# Patient Record
Sex: Female | Born: 1935 | Race: White | Hispanic: No | Marital: Married | Smoking: Never smoker
Health system: Southern US, Community
[De-identification: ages and names within clinical notes are randomized; demographics above are authoritative.]

## PROBLEM LIST (undated history)

## (undated) DIAGNOSIS — IMO0001 Reserved for inherently not codable concepts without codable children: Secondary | ICD-10-CM

## (undated) DIAGNOSIS — K219 Gastro-esophageal reflux disease without esophagitis: Secondary | ICD-10-CM

## (undated) DIAGNOSIS — I10 Essential (primary) hypertension: Secondary | ICD-10-CM

## (undated) DIAGNOSIS — M1990 Unspecified osteoarthritis, unspecified site: Secondary | ICD-10-CM

## (undated) HISTORY — PX: TOTAL KNEE ARTHROPLASTY: SHX125

## (undated) HISTORY — DX: Unspecified osteoarthritis, unspecified site: M19.90

## (undated) HISTORY — DX: Reserved for inherently not codable concepts without codable children: IMO0001

## (undated) HISTORY — DX: Essential (primary) hypertension: I10

## (undated) HISTORY — PX: BUNIONECTOMY: SHX129

## (undated) HISTORY — DX: Gastro-esophageal reflux disease without esophagitis: K21.9

## (undated) HISTORY — PX: BACK SURGERY: SHX140

---

## 2004-10-07 ENCOUNTER — Ambulatory Visit: Payer: Self-pay | Admitting: Family Medicine

## 2004-12-10 ENCOUNTER — Ambulatory Visit: Payer: Self-pay | Admitting: General Surgery

## 2005-10-26 ENCOUNTER — Other Ambulatory Visit: Payer: Self-pay

## 2005-10-26 ENCOUNTER — Ambulatory Visit: Payer: Self-pay | Admitting: Obstetrics and Gynecology

## 2005-11-08 ENCOUNTER — Inpatient Hospital Stay: Payer: Self-pay | Admitting: Obstetrics and Gynecology

## 2006-03-31 ENCOUNTER — Ambulatory Visit: Payer: Self-pay | Admitting: Family Medicine

## 2007-04-24 ENCOUNTER — Ambulatory Visit: Payer: Self-pay | Admitting: Ophthalmology

## 2007-05-01 ENCOUNTER — Ambulatory Visit: Payer: Self-pay | Admitting: Ophthalmology

## 2008-01-16 ENCOUNTER — Emergency Department: Payer: Self-pay | Admitting: Emergency Medicine

## 2008-06-11 ENCOUNTER — Ambulatory Visit: Payer: Self-pay | Admitting: Family Medicine

## 2008-07-03 ENCOUNTER — Ambulatory Visit: Payer: Self-pay | Admitting: Gastroenterology

## 2009-10-14 ENCOUNTER — Emergency Department: Payer: Self-pay | Admitting: Emergency Medicine

## 2009-11-19 ENCOUNTER — Ambulatory Visit: Payer: Self-pay | Admitting: Family Medicine

## 2010-02-02 ENCOUNTER — Ambulatory Visit: Payer: Self-pay | Admitting: Anesthesiology

## 2010-03-10 ENCOUNTER — Ambulatory Visit: Payer: Self-pay | Admitting: Anesthesiology

## 2010-04-02 ENCOUNTER — Ambulatory Visit: Payer: Self-pay | Admitting: Anesthesiology

## 2010-05-14 ENCOUNTER — Ambulatory Visit: Payer: Self-pay | Admitting: Anesthesiology

## 2010-06-11 ENCOUNTER — Ambulatory Visit: Payer: Self-pay | Admitting: Anesthesiology

## 2010-08-20 ENCOUNTER — Ambulatory Visit: Payer: Self-pay | Admitting: Family Medicine

## 2010-09-07 ENCOUNTER — Emergency Department: Payer: Self-pay | Admitting: Emergency Medicine

## 2010-11-10 DIAGNOSIS — I10 Essential (primary) hypertension: Secondary | ICD-10-CM

## 2010-11-10 DIAGNOSIS — R5381 Other malaise: Secondary | ICD-10-CM

## 2010-11-10 DIAGNOSIS — K219 Gastro-esophageal reflux disease without esophagitis: Secondary | ICD-10-CM

## 2010-11-10 DIAGNOSIS — M159 Polyosteoarthritis, unspecified: Secondary | ICD-10-CM

## 2010-11-10 DIAGNOSIS — R5383 Other fatigue: Secondary | ICD-10-CM

## 2010-11-20 DIAGNOSIS — J029 Acute pharyngitis, unspecified: Secondary | ICD-10-CM

## 2010-12-11 DIAGNOSIS — M1990 Unspecified osteoarthritis, unspecified site: Secondary | ICD-10-CM

## 2010-12-11 DIAGNOSIS — M810 Age-related osteoporosis without current pathological fracture: Secondary | ICD-10-CM

## 2010-12-11 DIAGNOSIS — I10 Essential (primary) hypertension: Secondary | ICD-10-CM

## 2011-01-14 DIAGNOSIS — M159 Polyosteoarthritis, unspecified: Secondary | ICD-10-CM

## 2011-01-14 DIAGNOSIS — G20 Parkinson's disease: Secondary | ICD-10-CM

## 2011-01-14 DIAGNOSIS — I10 Essential (primary) hypertension: Secondary | ICD-10-CM

## 2011-01-14 DIAGNOSIS — K219 Gastro-esophageal reflux disease without esophagitis: Secondary | ICD-10-CM

## 2011-01-19 ENCOUNTER — Ambulatory Visit: Payer: Self-pay | Admitting: Internal Medicine

## 2011-02-12 DIAGNOSIS — M1990 Unspecified osteoarthritis, unspecified site: Secondary | ICD-10-CM

## 2011-02-12 DIAGNOSIS — I10 Essential (primary) hypertension: Secondary | ICD-10-CM

## 2011-02-12 DIAGNOSIS — M810 Age-related osteoporosis without current pathological fracture: Secondary | ICD-10-CM

## 2011-03-29 DIAGNOSIS — M25579 Pain in unspecified ankle and joints of unspecified foot: Secondary | ICD-10-CM

## 2011-04-26 DIAGNOSIS — K219 Gastro-esophageal reflux disease without esophagitis: Secondary | ICD-10-CM

## 2011-04-26 DIAGNOSIS — I10 Essential (primary) hypertension: Secondary | ICD-10-CM

## 2011-04-26 DIAGNOSIS — G20 Parkinson's disease: Secondary | ICD-10-CM

## 2011-04-26 DIAGNOSIS — M159 Polyosteoarthritis, unspecified: Secondary | ICD-10-CM

## 2011-05-06 DIAGNOSIS — M25579 Pain in unspecified ankle and joints of unspecified foot: Secondary | ICD-10-CM

## 2011-05-06 DIAGNOSIS — L84 Corns and callosities: Secondary | ICD-10-CM

## 2011-05-24 DIAGNOSIS — I498 Other specified cardiac arrhythmias: Secondary | ICD-10-CM

## 2011-06-16 DIAGNOSIS — K219 Gastro-esophageal reflux disease without esophagitis: Secondary | ICD-10-CM

## 2011-06-16 DIAGNOSIS — I10 Essential (primary) hypertension: Secondary | ICD-10-CM

## 2011-06-16 DIAGNOSIS — M810 Age-related osteoporosis without current pathological fracture: Secondary | ICD-10-CM

## 2011-06-16 DIAGNOSIS — M1990 Unspecified osteoarthritis, unspecified site: Secondary | ICD-10-CM

## 2011-08-20 DIAGNOSIS — I10 Essential (primary) hypertension: Secondary | ICD-10-CM

## 2011-08-20 DIAGNOSIS — M1990 Unspecified osteoarthritis, unspecified site: Secondary | ICD-10-CM

## 2011-08-20 DIAGNOSIS — IMO0002 Reserved for concepts with insufficient information to code with codable children: Secondary | ICD-10-CM

## 2011-09-16 DIAGNOSIS — R1033 Periumbilical pain: Secondary | ICD-10-CM

## 2011-09-22 DIAGNOSIS — K922 Gastrointestinal hemorrhage, unspecified: Secondary | ICD-10-CM

## 2011-09-22 DIAGNOSIS — R5383 Other fatigue: Secondary | ICD-10-CM

## 2011-09-22 DIAGNOSIS — R5381 Other malaise: Secondary | ICD-10-CM

## 2011-09-27 ENCOUNTER — Ambulatory Visit: Payer: Self-pay | Admitting: Family Medicine

## 2011-09-28 ENCOUNTER — Encounter: Payer: Self-pay | Admitting: Family Medicine

## 2011-09-29 DIAGNOSIS — R634 Abnormal weight loss: Secondary | ICD-10-CM

## 2011-09-29 DIAGNOSIS — F329 Major depressive disorder, single episode, unspecified: Secondary | ICD-10-CM

## 2011-09-29 DIAGNOSIS — D539 Nutritional anemia, unspecified: Secondary | ICD-10-CM

## 2011-09-29 DIAGNOSIS — I10 Essential (primary) hypertension: Secondary | ICD-10-CM

## 2011-10-20 ENCOUNTER — Ambulatory Visit: Payer: Self-pay | Admitting: Gastroenterology

## 2011-11-24 ENCOUNTER — Ambulatory Visit: Payer: Self-pay | Admitting: Gastroenterology

## 2011-12-02 ENCOUNTER — Encounter: Payer: Self-pay | Admitting: Internal Medicine

## 2011-12-08 DIAGNOSIS — M810 Age-related osteoporosis without current pathological fracture: Secondary | ICD-10-CM

## 2011-12-08 DIAGNOSIS — I10 Essential (primary) hypertension: Secondary | ICD-10-CM

## 2011-12-08 DIAGNOSIS — M1990 Unspecified osteoarthritis, unspecified site: Secondary | ICD-10-CM

## 2011-12-10 DIAGNOSIS — M1990 Unspecified osteoarthritis, unspecified site: Secondary | ICD-10-CM

## 2011-12-24 DIAGNOSIS — M1990 Unspecified osteoarthritis, unspecified site: Secondary | ICD-10-CM

## 2012-02-02 DIAGNOSIS — M1990 Unspecified osteoarthritis, unspecified site: Secondary | ICD-10-CM

## 2012-02-02 DIAGNOSIS — F329 Major depressive disorder, single episode, unspecified: Secondary | ICD-10-CM

## 2012-02-02 DIAGNOSIS — I10 Essential (primary) hypertension: Secondary | ICD-10-CM

## 2012-04-11 DIAGNOSIS — F329 Major depressive disorder, single episode, unspecified: Secondary | ICD-10-CM

## 2012-04-11 DIAGNOSIS — G20 Parkinson's disease: Secondary | ICD-10-CM

## 2012-04-11 DIAGNOSIS — I10 Essential (primary) hypertension: Secondary | ICD-10-CM

## 2012-04-11 DIAGNOSIS — K210 Gastro-esophageal reflux disease with esophagitis: Secondary | ICD-10-CM

## 2012-06-14 DIAGNOSIS — IMO0002 Reserved for concepts with insufficient information to code with codable children: Secondary | ICD-10-CM

## 2012-06-14 DIAGNOSIS — I10 Essential (primary) hypertension: Secondary | ICD-10-CM

## 2012-06-14 DIAGNOSIS — M1990 Unspecified osteoarthritis, unspecified site: Secondary | ICD-10-CM

## 2012-08-11 DIAGNOSIS — M1990 Unspecified osteoarthritis, unspecified site: Secondary | ICD-10-CM

## 2012-08-11 DIAGNOSIS — G20 Parkinson's disease: Secondary | ICD-10-CM

## 2012-08-11 DIAGNOSIS — I10 Essential (primary) hypertension: Secondary | ICD-10-CM

## 2012-08-11 DIAGNOSIS — IMO0002 Reserved for concepts with insufficient information to code with codable children: Secondary | ICD-10-CM

## 2012-08-11 DIAGNOSIS — F329 Major depressive disorder, single episode, unspecified: Secondary | ICD-10-CM

## 2012-10-20 DIAGNOSIS — G20 Parkinson's disease: Secondary | ICD-10-CM

## 2012-10-20 DIAGNOSIS — F329 Major depressive disorder, single episode, unspecified: Secondary | ICD-10-CM

## 2012-10-20 DIAGNOSIS — M810 Age-related osteoporosis without current pathological fracture: Secondary | ICD-10-CM

## 2012-10-20 DIAGNOSIS — I10 Essential (primary) hypertension: Secondary | ICD-10-CM

## 2012-10-20 DIAGNOSIS — K219 Gastro-esophageal reflux disease without esophagitis: Secondary | ICD-10-CM

## 2012-11-16 DIAGNOSIS — I6789 Other cerebrovascular disease: Secondary | ICD-10-CM

## 2012-11-30 DIAGNOSIS — I6990 Unspecified sequelae of unspecified cerebrovascular disease: Secondary | ICD-10-CM

## 2012-11-30 DIAGNOSIS — G20 Parkinson's disease: Secondary | ICD-10-CM

## 2012-11-30 DIAGNOSIS — F329 Major depressive disorder, single episode, unspecified: Secondary | ICD-10-CM

## 2012-11-30 DIAGNOSIS — K210 Gastro-esophageal reflux disease with esophagitis: Secondary | ICD-10-CM

## 2012-12-26 DIAGNOSIS — L0291 Cutaneous abscess, unspecified: Secondary | ICD-10-CM

## 2012-12-26 DIAGNOSIS — L0390 Cellulitis, unspecified: Secondary | ICD-10-CM

## 2013-01-03 DIAGNOSIS — F99 Mental disorder, not otherwise specified: Secondary | ICD-10-CM

## 2013-01-03 DIAGNOSIS — G9009 Other idiopathic peripheral autonomic neuropathy: Secondary | ICD-10-CM

## 2013-01-31 DIAGNOSIS — I10 Essential (primary) hypertension: Secondary | ICD-10-CM

## 2013-01-31 DIAGNOSIS — M1710 Unilateral primary osteoarthritis, unspecified knee: Secondary | ICD-10-CM

## 2013-01-31 DIAGNOSIS — G219 Secondary parkinsonism, unspecified: Secondary | ICD-10-CM

## 2013-01-31 DIAGNOSIS — I69998 Other sequelae following unspecified cerebrovascular disease: Secondary | ICD-10-CM

## 2013-01-31 DIAGNOSIS — F329 Major depressive disorder, single episode, unspecified: Secondary | ICD-10-CM

## 2013-01-31 DIAGNOSIS — N052 Unspecified nephritic syndrome with diffuse membranous glomerulonephritis: Secondary | ICD-10-CM

## 2013-03-27 DIAGNOSIS — B9789 Other viral agents as the cause of diseases classified elsewhere: Secondary | ICD-10-CM

## 2013-04-19 DIAGNOSIS — K2100 Gastro-esophageal reflux disease with esophagitis, without bleeding: Secondary | ICD-10-CM

## 2013-04-19 DIAGNOSIS — F0150 Vascular dementia without behavioral disturbance: Secondary | ICD-10-CM

## 2013-04-19 DIAGNOSIS — G20 Parkinson's disease: Secondary | ICD-10-CM

## 2013-04-19 DIAGNOSIS — I6990 Unspecified sequelae of unspecified cerebrovascular disease: Secondary | ICD-10-CM

## 2013-06-06 DIAGNOSIS — F3289 Other specified depressive episodes: Secondary | ICD-10-CM

## 2013-06-06 DIAGNOSIS — F329 Major depressive disorder, single episode, unspecified: Secondary | ICD-10-CM

## 2013-06-06 DIAGNOSIS — IMO0002 Reserved for concepts with insufficient information to code with codable children: Secondary | ICD-10-CM

## 2013-06-06 DIAGNOSIS — M1710 Unilateral primary osteoarthritis, unspecified knee: Secondary | ICD-10-CM

## 2013-06-06 DIAGNOSIS — N052 Unspecified nephritic syndrome with diffuse membranous glomerulonephritis: Secondary | ICD-10-CM

## 2013-06-06 DIAGNOSIS — G20 Parkinson's disease: Secondary | ICD-10-CM

## 2013-06-06 DIAGNOSIS — I69998 Other sequelae following unspecified cerebrovascular disease: Secondary | ICD-10-CM

## 2013-06-06 DIAGNOSIS — I10 Essential (primary) hypertension: Secondary | ICD-10-CM

## 2013-08-03 DIAGNOSIS — K5900 Constipation, unspecified: Secondary | ICD-10-CM

## 2013-08-03 DIAGNOSIS — G20 Parkinson's disease: Secondary | ICD-10-CM

## 2013-08-03 DIAGNOSIS — Z96659 Presence of unspecified artificial knee joint: Secondary | ICD-10-CM

## 2013-08-03 DIAGNOSIS — I10 Essential (primary) hypertension: Secondary | ICD-10-CM

## 2013-08-03 DIAGNOSIS — M1990 Unspecified osteoarthritis, unspecified site: Secondary | ICD-10-CM

## 2013-08-21 DIAGNOSIS — B029 Zoster without complications: Secondary | ICD-10-CM

## 2013-10-01 DIAGNOSIS — M24576 Contracture, unspecified foot: Secondary | ICD-10-CM

## 2013-10-01 DIAGNOSIS — M24573 Contracture, unspecified ankle: Secondary | ICD-10-CM

## 2013-12-04 DIAGNOSIS — I6990 Unspecified sequelae of unspecified cerebrovascular disease: Secondary | ICD-10-CM

## 2013-12-04 DIAGNOSIS — K2100 Gastro-esophageal reflux disease with esophagitis, without bleeding: Secondary | ICD-10-CM

## 2013-12-04 DIAGNOSIS — I10 Essential (primary) hypertension: Secondary | ICD-10-CM

## 2013-12-04 DIAGNOSIS — F0150 Vascular dementia without behavioral disturbance: Secondary | ICD-10-CM

## 2013-12-04 DIAGNOSIS — F39 Unspecified mood [affective] disorder: Secondary | ICD-10-CM

## 2013-12-14 ENCOUNTER — Ambulatory Visit (INDEPENDENT_AMBULATORY_CARE_PROVIDER_SITE_OTHER): Payer: Medicare Other

## 2013-12-14 ENCOUNTER — Encounter: Payer: Self-pay | Admitting: Podiatry

## 2013-12-14 ENCOUNTER — Ambulatory Visit (INDEPENDENT_AMBULATORY_CARE_PROVIDER_SITE_OTHER): Payer: Medicare Other | Admitting: Podiatry

## 2013-12-14 ENCOUNTER — Other Ambulatory Visit: Payer: Self-pay | Admitting: *Deleted

## 2013-12-14 VITALS — Ht 64.0 in | Wt 162.0 lb

## 2013-12-14 DIAGNOSIS — M2020 Hallux rigidus, unspecified foot: Secondary | ICD-10-CM | POA: Diagnosis not present

## 2013-12-14 DIAGNOSIS — M79674 Pain in right toe(s): Secondary | ICD-10-CM

## 2013-12-14 DIAGNOSIS — B351 Tinea unguium: Secondary | ICD-10-CM

## 2013-12-14 DIAGNOSIS — M79609 Pain in unspecified limb: Secondary | ICD-10-CM | POA: Diagnosis not present

## 2013-12-14 DIAGNOSIS — M2021 Hallux rigidus, right foot: Secondary | ICD-10-CM

## 2013-12-14 DIAGNOSIS — M79673 Pain in unspecified foot: Secondary | ICD-10-CM

## 2013-12-14 NOTE — Progress Notes (Signed)
Subjective:     Patient ID: Jocelyn Bowen, female   DOB: [birth year], 80 y.o.   MRN: [id number]  Foot Pain   patient presents with daughter stating my toe is way up in the air and I had surgery in the 80s on my right foot and I just get pain when I try to wear shoes and also my toenails are thick long and impossible for me to cut   Review of Systems  All other systems reviewed and are negative.      Objective:   Physical Exam  Nursing note and vitals reviewed. Constitutional: She is oriented to person, place, and time.  Cardiovascular: Intact distal pulses.   Musculoskeletal: Normal range of motion.  Neurological: She is oriented to person, place, and time.  Skin: Skin is warm and dry.   vascular status found to be intact with mild diminishment of neurological status of both feet. Patient has significant lifting of the right big toe with incision sites on all MPJs of both feet noted and severe thickness with yellow discoloration of the nailbeds 1-5 both feet with brittle appearance and pain. Patient is noted to have digits that are well perfused and is well oriented x3 but is in a wheelchair     Assessment:     Severe elevation of the first MPJ right secondary to previous surgery with mycotic nail infection 1-5 both feet that are painful    Plan:     H&P reviewed and today I discussed x-rays with her of the right foot. I do not recommend surgery and recommended padding to try to lower the toe and due to the fact nails are so tender I debrided nailbeds 1-5 both feet which will be done on a continuing basis. Tolerated well

## 2013-12-14 NOTE — Progress Notes (Signed)
   Subjective:    Patient ID: Jocelyn Bowen, female    DOB: January 10, 1925, 80 y.o.   MRN: [id number]  HPI Comments: Having concerns with the right great toe, it is sticking straight up. The last year it has got progressively worse, she cant seem to walk and its making it worse , also have some concerns about some dead skin around the feet and toes   Foot Pain      Review of Systems  All other systems reviewed and are negative.      Objective:   Physical Exam        Assessment & Plan:

## 2014-01-28 IMAGING — CT CT ABD-PELV W/ CM
1 of 2 series · 15 of 32 positions shown, 19 images · non-contrast
Comparison: none

REASON FOR EXAM: LLQ Pain Onset Anemia  Allergic Iodine
COMMENTS:

PROCEDURE:     CT  - CT ABDOMEN / PELVIS  W  - September 27, 2011  [DATE]
RESULT:
TECHNIQUE: Helical 3 mm sections were obtained from the lung bases through
the pubic symphysis status post intravenous administration of 100 ml of
Dsovue-BYY and oral contrast.

[Series 2: 3mm soft tissue · axial · 0.68mm/px · z∈[-70,+332]mm · 15 of 148 slices shown, 19 images]
[im 7/148  soft-tissue]
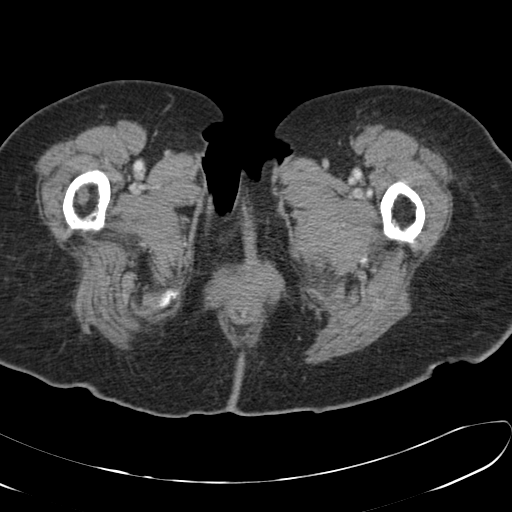
[im 7/148  bone]
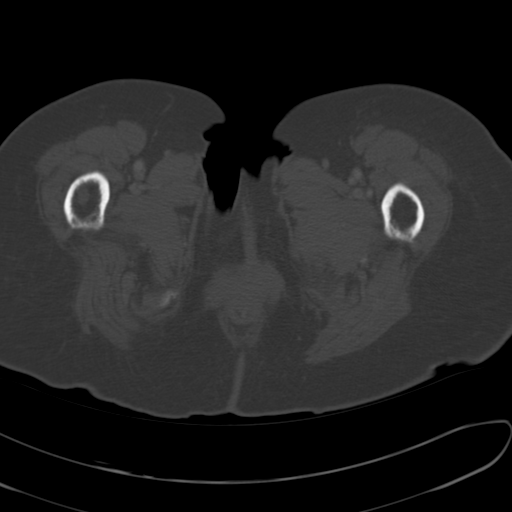
[im 19/148  soft-tissue]
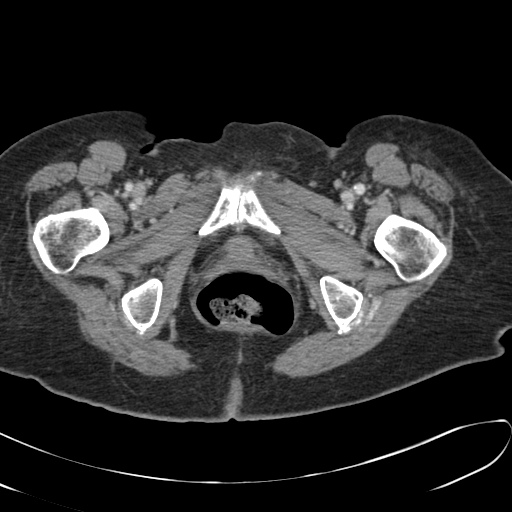
[im 31/148  soft-tissue]
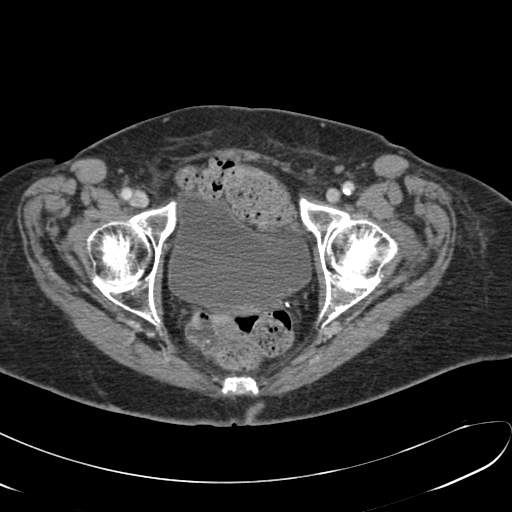
[im 43/148  soft-tissue]
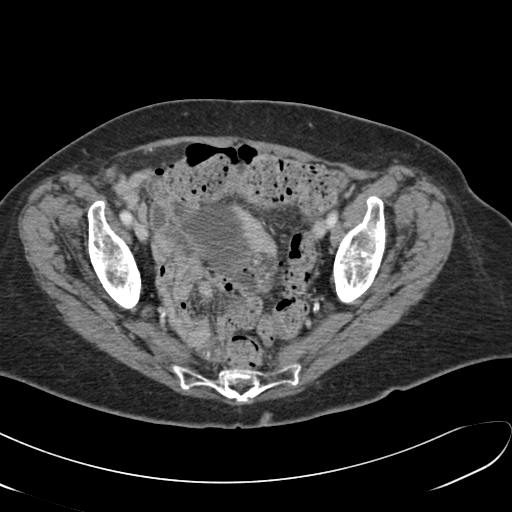
[im 50/148  soft-tissue]
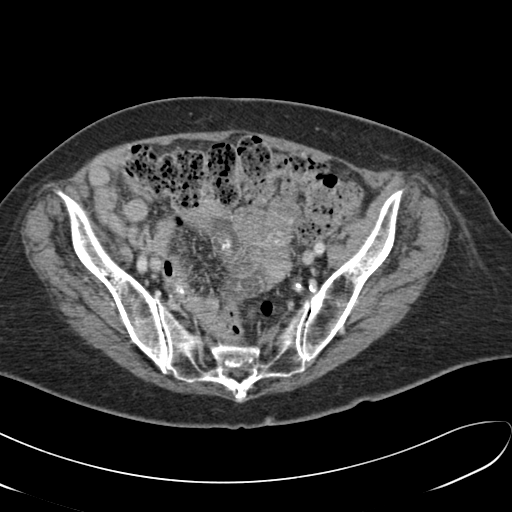
[im 62/148  soft-tissue]
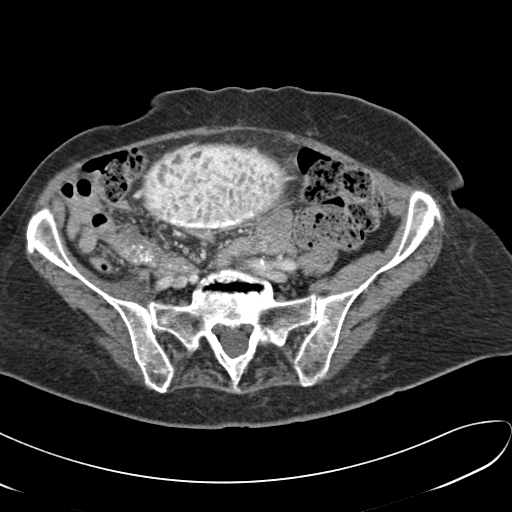
[im 74/148  soft-tissue]
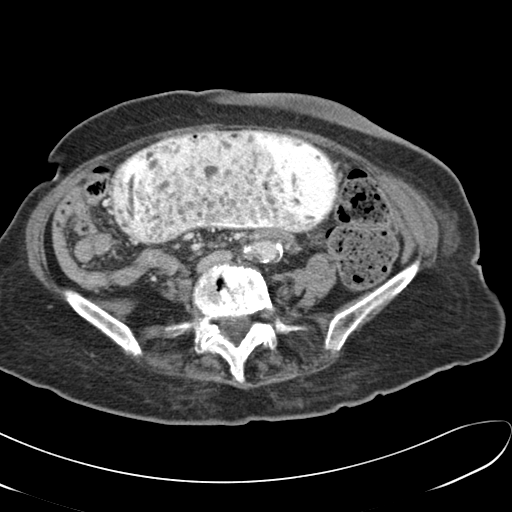
[im 86/148  soft-tissue]
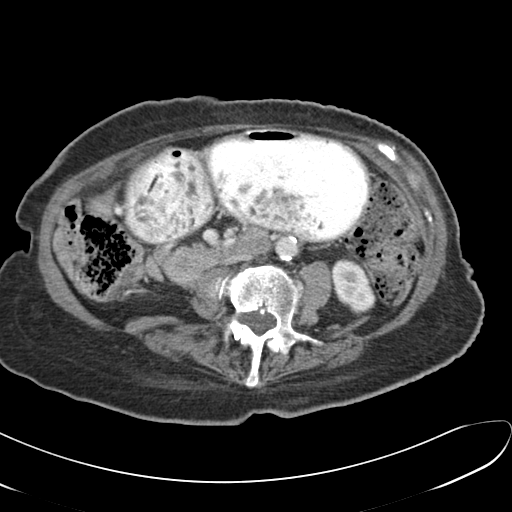
[im 99/148  soft-tissue]
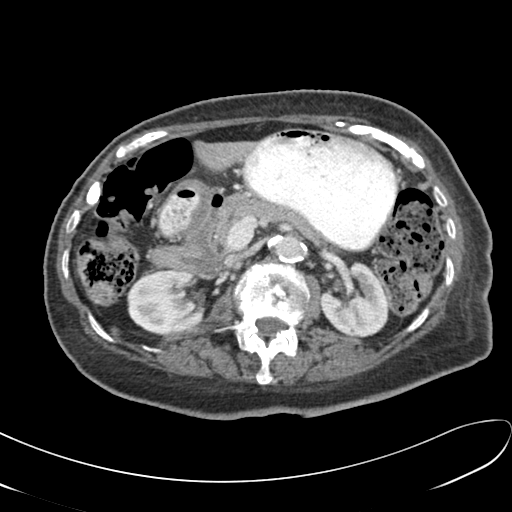
[im 99/148  bone]
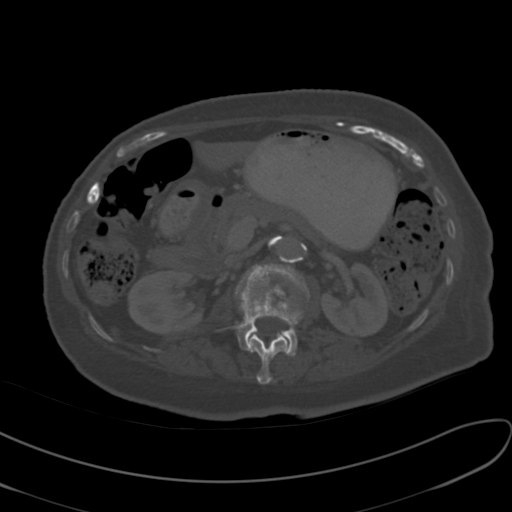
[im 105/148  soft-tissue]
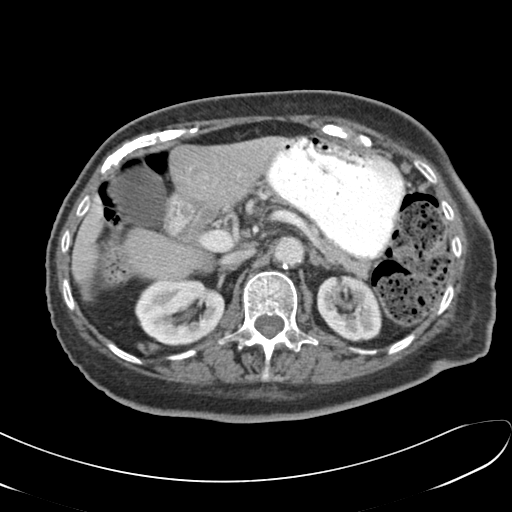
[im 117/148  soft-tissue]
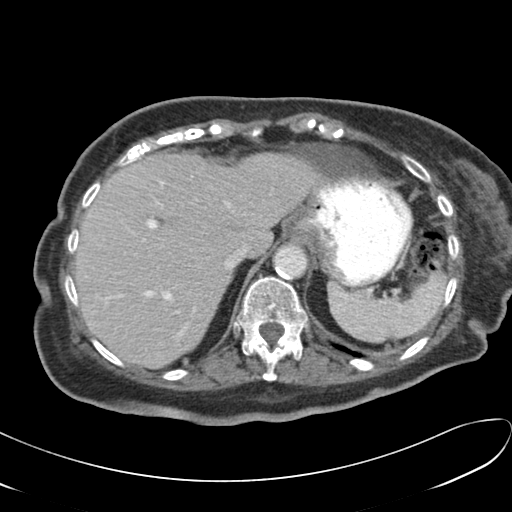
[im 123/148  lung]
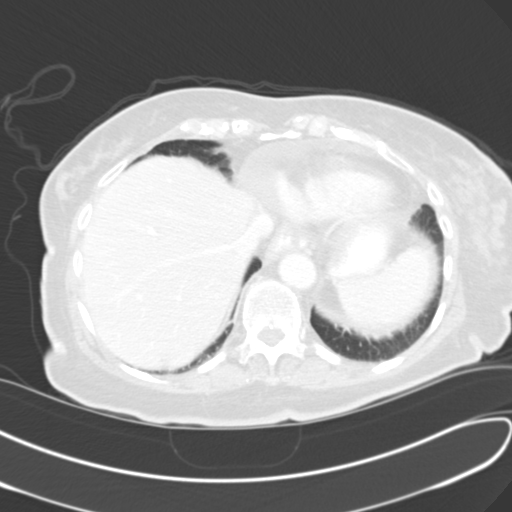
[im 129/148  soft-tissue]
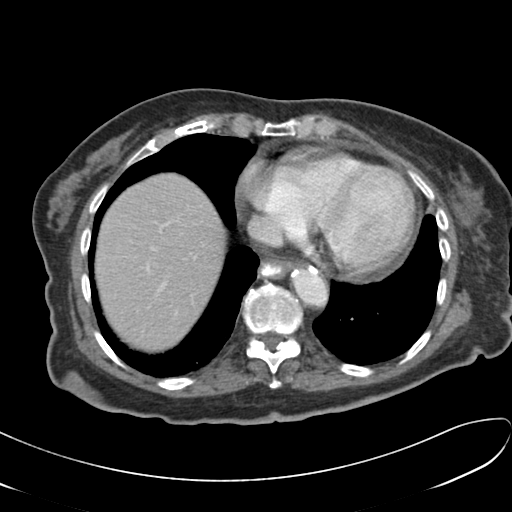
[im 129/148  lung]
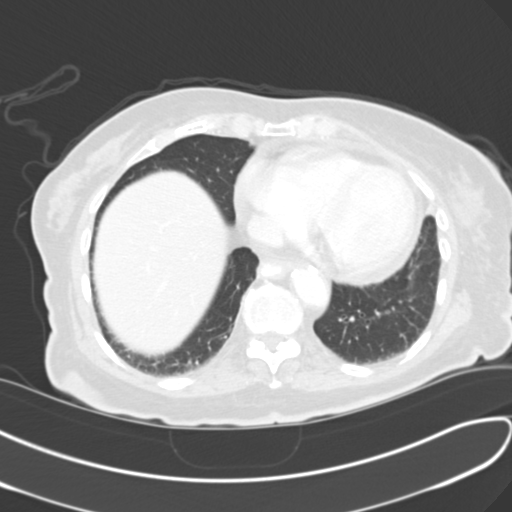
[im 135/148  lung]
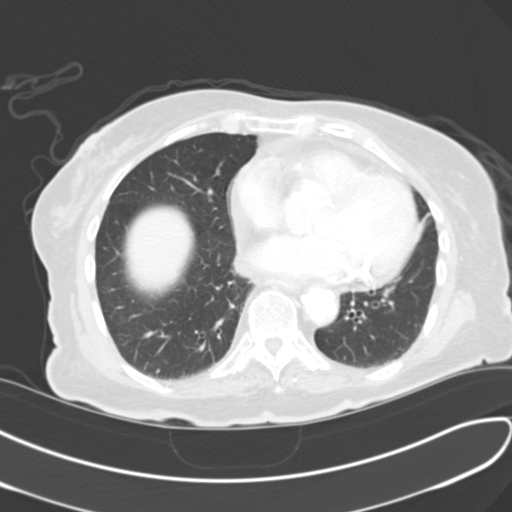
[im 141/148  soft-tissue]
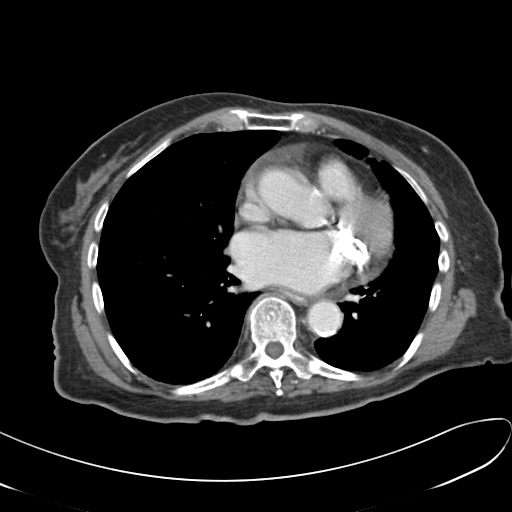
[im 141/148  lung]
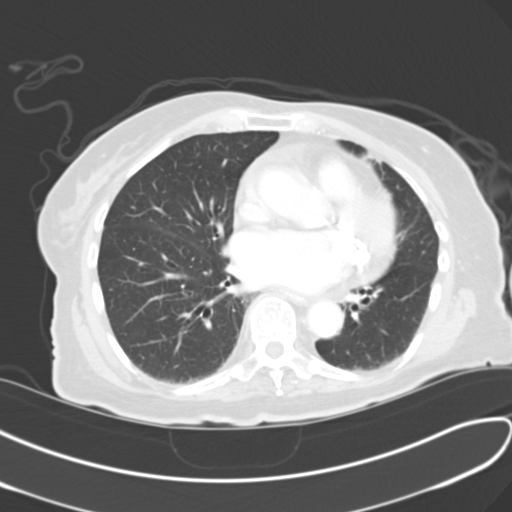

[15 of 32 positions shown; findings below may reference images not displayed]

FINDINGS: Evaluation of the lung bases demonstrates prominence of the
interstitial markings and hypoventilation. A small pericardial effusion is
identified.

The liver, spleen, adrenals, pancreas, and kidneys are unremarkable.

The stomach is distended and filled with contrast as well as likely food
material. The small bowel distal to the stomach appears to be decompressed.
A large amount of stool is appreciated throughout the colon. There does not
appear to be evidence of pneumoperitoneum, free fluid or loculated fluid
collections. There is no evidence of an abdominal aortic aneurysm. The
celiac, SMA, IMA and portal vein are opacified.

There is no evidence of abdominal or pelvic free fluid, loculated fluid
collections, masses or adenopathy.
IMPRESSION: 1.  Large amount of contrast and food debris within the stomach and clinical
correlation is recommended.
2.  Large amount of stool within the bowel and again clinical correlation is
recommended. Constipation is a diagnostic consideration.
3.  Not mentioned above, the urinary bladder is distended with urine and
this may represent the patient's need to void or possibly a component of
bladder outlet obstruction.

## 2014-02-01 DIAGNOSIS — I69398 Other sequelae of cerebral infarction: Secondary | ICD-10-CM

## 2014-02-01 DIAGNOSIS — G3183 Dementia with Lewy bodies: Secondary | ICD-10-CM

## 2014-02-01 DIAGNOSIS — M1990 Unspecified osteoarthritis, unspecified site: Secondary | ICD-10-CM

## 2014-02-01 DIAGNOSIS — I10 Essential (primary) hypertension: Secondary | ICD-10-CM

## 2014-02-01 DIAGNOSIS — F323 Major depressive disorder, single episode, severe with psychotic features: Secondary | ICD-10-CM

## 2014-02-01 DIAGNOSIS — K219 Gastro-esophageal reflux disease without esophagitis: Secondary | ICD-10-CM

## 2014-02-01 DIAGNOSIS — F0151 Vascular dementia with behavioral disturbance: Secondary | ICD-10-CM

## 2014-02-01 DIAGNOSIS — G20 Parkinson's disease: Secondary | ICD-10-CM

## 2014-03-15 ENCOUNTER — Other Ambulatory Visit: Payer: Medicare Other

## 2014-04-09 DIAGNOSIS — H1045 Other chronic allergic conjunctivitis: Secondary | ICD-10-CM

## 2014-06-06 DIAGNOSIS — F323 Major depressive disorder, single episode, severe with psychotic features: Secondary | ICD-10-CM

## 2014-06-06 DIAGNOSIS — K219 Gastro-esophageal reflux disease without esophagitis: Secondary | ICD-10-CM

## 2014-06-06 DIAGNOSIS — F0150 Vascular dementia without behavioral disturbance: Secondary | ICD-10-CM | POA: Diagnosis not present

## 2014-06-06 DIAGNOSIS — M1990 Unspecified osteoarthritis, unspecified site: Secondary | ICD-10-CM | POA: Diagnosis not present

## 2014-06-06 DIAGNOSIS — I10 Essential (primary) hypertension: Secondary | ICD-10-CM | POA: Diagnosis not present

## 2014-08-06 DIAGNOSIS — I69398 Other sequelae of cerebral infarction: Secondary | ICD-10-CM

## 2014-08-06 DIAGNOSIS — F39 Unspecified mood [affective] disorder: Secondary | ICD-10-CM

## 2014-08-06 DIAGNOSIS — K219 Gastro-esophageal reflux disease without esophagitis: Secondary | ICD-10-CM

## 2014-08-06 DIAGNOSIS — F0150 Vascular dementia without behavioral disturbance: Secondary | ICD-10-CM

## 2014-08-06 DIAGNOSIS — I10 Essential (primary) hypertension: Secondary | ICD-10-CM

## 2014-08-06 DIAGNOSIS — G20 Parkinson's disease: Secondary | ICD-10-CM | POA: Diagnosis not present

## 2014-10-09 DIAGNOSIS — K219 Gastro-esophageal reflux disease without esophagitis: Secondary | ICD-10-CM

## 2014-10-09 DIAGNOSIS — G20 Parkinson's disease: Secondary | ICD-10-CM | POA: Diagnosis not present

## 2014-10-09 DIAGNOSIS — F39 Unspecified mood [affective] disorder: Secondary | ICD-10-CM

## 2014-10-09 DIAGNOSIS — I10 Essential (primary) hypertension: Secondary | ICD-10-CM

## 2014-10-09 DIAGNOSIS — I69398 Other sequelae of cerebral infarction: Secondary | ICD-10-CM | POA: Diagnosis not present

## 2014-10-09 DIAGNOSIS — F0150 Vascular dementia without behavioral disturbance: Secondary | ICD-10-CM

## 2014-12-05 DIAGNOSIS — K210 Gastro-esophageal reflux disease with esophagitis: Secondary | ICD-10-CM | POA: Diagnosis not present

## 2014-12-05 DIAGNOSIS — G20 Parkinson's disease: Secondary | ICD-10-CM | POA: Diagnosis not present

## 2014-12-05 DIAGNOSIS — F39 Unspecified mood [affective] disorder: Secondary | ICD-10-CM

## 2014-12-05 DIAGNOSIS — I69398 Other sequelae of cerebral infarction: Secondary | ICD-10-CM | POA: Diagnosis not present

## 2014-12-05 DIAGNOSIS — I10 Essential (primary) hypertension: Secondary | ICD-10-CM | POA: Diagnosis not present

## 2014-12-05 DIAGNOSIS — F0150 Vascular dementia without behavioral disturbance: Secondary | ICD-10-CM

## 2015-02-07 DIAGNOSIS — I69398 Other sequelae of cerebral infarction: Secondary | ICD-10-CM

## 2015-02-07 DIAGNOSIS — K219 Gastro-esophageal reflux disease without esophagitis: Secondary | ICD-10-CM | POA: Diagnosis not present

## 2015-02-07 DIAGNOSIS — G20 Parkinson's disease: Secondary | ICD-10-CM | POA: Diagnosis not present

## 2015-02-07 DIAGNOSIS — F39 Unspecified mood [affective] disorder: Secondary | ICD-10-CM

## 2015-02-07 DIAGNOSIS — I10 Essential (primary) hypertension: Secondary | ICD-10-CM | POA: Diagnosis not present

## 2015-02-07 DIAGNOSIS — F0150 Vascular dementia without behavioral disturbance: Secondary | ICD-10-CM

## 2015-02-07 DIAGNOSIS — M1990 Unspecified osteoarthritis, unspecified site: Secondary | ICD-10-CM

## 2015-05-29 DIAGNOSIS — K219 Gastro-esophageal reflux disease without esophagitis: Secondary | ICD-10-CM

## 2015-05-29 DIAGNOSIS — M159 Polyosteoarthritis, unspecified: Secondary | ICD-10-CM

## 2015-05-29 DIAGNOSIS — F39 Unspecified mood [affective] disorder: Secondary | ICD-10-CM

## 2015-05-29 DIAGNOSIS — G20 Parkinson's disease: Secondary | ICD-10-CM | POA: Diagnosis not present

## 2015-05-29 DIAGNOSIS — G839 Paralytic syndrome, unspecified: Secondary | ICD-10-CM

## 2015-05-29 DIAGNOSIS — F0150 Vascular dementia without behavioral disturbance: Secondary | ICD-10-CM | POA: Diagnosis not present

## 2015-06-02 DIAGNOSIS — H10029 Other mucopurulent conjunctivitis, unspecified eye: Secondary | ICD-10-CM | POA: Diagnosis not present

## 2015-07-30 DIAGNOSIS — I69398 Other sequelae of cerebral infarction: Secondary | ICD-10-CM | POA: Diagnosis not present

## 2015-07-30 DIAGNOSIS — K219 Gastro-esophageal reflux disease without esophagitis: Secondary | ICD-10-CM | POA: Diagnosis not present

## 2015-07-30 DIAGNOSIS — G20 Parkinson's disease: Secondary | ICD-10-CM

## 2015-07-30 DIAGNOSIS — M1990 Unspecified osteoarthritis, unspecified site: Secondary | ICD-10-CM | POA: Diagnosis not present

## 2015-07-30 DIAGNOSIS — F39 Unspecified mood [affective] disorder: Secondary | ICD-10-CM

## 2015-07-30 DIAGNOSIS — F0150 Vascular dementia without behavioral disturbance: Secondary | ICD-10-CM

## 2015-08-04 DIAGNOSIS — H10219 Acute toxic conjunctivitis, unspecified eye: Secondary | ICD-10-CM

## 2015-10-07 DIAGNOSIS — K219 Gastro-esophageal reflux disease without esophagitis: Secondary | ICD-10-CM

## 2015-10-07 DIAGNOSIS — G839 Paralytic syndrome, unspecified: Secondary | ICD-10-CM | POA: Diagnosis not present

## 2015-10-07 DIAGNOSIS — M159 Polyosteoarthritis, unspecified: Secondary | ICD-10-CM

## 2015-10-07 DIAGNOSIS — F0150 Vascular dementia without behavioral disturbance: Secondary | ICD-10-CM

## 2015-10-07 DIAGNOSIS — F39 Unspecified mood [affective] disorder: Secondary | ICD-10-CM

## 2015-10-07 DIAGNOSIS — G20 Parkinson's disease: Secondary | ICD-10-CM | POA: Diagnosis not present

## 2015-12-10 DIAGNOSIS — K219 Gastro-esophageal reflux disease without esophagitis: Secondary | ICD-10-CM | POA: Diagnosis not present

## 2015-12-10 DIAGNOSIS — G20 Parkinson's disease: Secondary | ICD-10-CM | POA: Diagnosis not present

## 2015-12-10 DIAGNOSIS — F0150 Vascular dementia without behavioral disturbance: Secondary | ICD-10-CM

## 2015-12-10 DIAGNOSIS — M1990 Unspecified osteoarthritis, unspecified site: Secondary | ICD-10-CM

## 2015-12-10 DIAGNOSIS — F39 Unspecified mood [affective] disorder: Secondary | ICD-10-CM

## 2015-12-10 DIAGNOSIS — I69398 Other sequelae of cerebral infarction: Secondary | ICD-10-CM | POA: Diagnosis not present

## 2016-02-06 DIAGNOSIS — J45991 Cough variant asthma: Secondary | ICD-10-CM | POA: Diagnosis not present

## 2016-02-06 DIAGNOSIS — R509 Fever, unspecified: Secondary | ICD-10-CM

## 2016-02-09 ENCOUNTER — Telehealth: Payer: Self-pay

## 2016-02-09 NOTE — Telephone Encounter (Signed)
PLEASE NOTE: All timestamps contained within this report are represented as [place] Time. CONFIDENTIALTY NOTICE: This fax transmission is intended only for the addressee. It contains information that is legally privileged, confidential or otherwise protected from use or disclosure. If you are not the intended recipient, you are strictly prohibited from reviewing, disclosing, copying using or disseminating any of this information or taking any action in reliance on or regarding this information. If you have received this fax in error, please notify us immediately by telephone so that we can arrange for its return to us. Phone: [phone], Toll-Free: [phone], Fax: [phone] Page: 1 of 1 Call Id: [id number] [facility] Client Nonclinical Telephone Record [facility] - [facility] Physician Tillman AbideLetvak, Richard - MD Contact Type Call Who Is Calling Physician / Provider / Hospital Call Type [facility] Reason for Call Request to speak to Physician Initial Comment Caller [facility]leg[facility]athy [facility] to speak to on call physician regarding xray results. Additional Comment Patient Name [name] Patient DOB [birth year] [facility] Physician Number [phone] [facility] Paging [facility] DateTime Result/Outcome Message Type Notes Evelena PeatBurchette, Bruce - MD [phone] 02/06/2016 6:59:03 PM Called On Call Provider - Reached Doctor Paged Evelena PeatBurchette, Bruce - MD 02/06/2016 6:59:11 PM Spoke with On Call - General Message Result Call Closed By: De Hollingsheadaniel Goldston Transaction Date/Time: 02/06/2016 6:51:51 PM (ET)

## 2016-02-09 NOTE — Telephone Encounter (Signed)
This has been adressed 

## 2016-02-12 DIAGNOSIS — F0150 Vascular dementia without behavioral disturbance: Secondary | ICD-10-CM | POA: Diagnosis not present

## 2016-02-12 DIAGNOSIS — G839 Paralytic syndrome, unspecified: Secondary | ICD-10-CM | POA: Diagnosis not present

## 2016-02-12 DIAGNOSIS — J690 Pneumonitis due to inhalation of food and vomit: Secondary | ICD-10-CM | POA: Diagnosis not present

## 2016-02-12 DIAGNOSIS — G20 Parkinson's disease: Secondary | ICD-10-CM | POA: Diagnosis not present

## 2016-02-22 ENCOUNTER — Telehealth: Payer: Self-pay | Admitting: Family Medicine

## 2016-02-22 NOTE — Telephone Encounter (Signed)
Received a [facility] that this pt is actively dying.  Dr. Alphonsus SiasLetvak had already approved comfort morphine and lorazepam measures; they needed a verbal ok to start this protocol.  Gave orders and asked RN to call me if they need anything further in the care of Ms. Jocelyn PriceMyers

## 2016-02-23 ENCOUNTER — Telehealth: Payer: Self-pay

## 2016-02-23 NOTE — Telephone Encounter (Signed)
Seen today with daughter Is actively dying but comfortable

## 2016-02-23 NOTE — Telephone Encounter (Signed)
Noted Hospice involved in care I will check her today

## 2016-02-23 NOTE — Telephone Encounter (Signed)
PLEASE NOTE: All timestamps contained within this report are represented as [place] Time. CONFIDENTIALTY NOTICE: This fax transmission is intended only for the addressee. It contains information that is legally privileged, confidential or otherwise protected from use or disclosure. If you are not the intended recipient, you are strictly prohibited from reviewing, disclosing, copying using or disseminating any of this information or taking any action in reliance on or regarding this information. If you have received this fax in error, please notify us immediately by telephone so that we can arrange for its return to us. Phone: [phone], Toll-Free: [phone], Fax: [phone] Page: 1 of 1 Call Id: [id number] [facility] Nonclinical Telephone Record [facility] [place] [facility] - [facility] Physician Tillman AbideLetvak, Richard - MD Contact Type Call Call Type Home Care Hospice Page Now Who Is Calling Home Health / Hospice Agency Caller Name Terri Facility [facility] Facility Number [phone] Patient Name Trinna BalloonVirginia Monjaras Patient DOB 05/14/1924 Reason for Call Request to speak to Physician Initial Comment Caller Terri states [facility] and is actively [facility] Additional Comment Paging DoctorName Phone DateTime Result/Outcome Message Type Notes Abbe AmsterdamCopland, Jessica- MD [phone] 02/22/2016 10:58:08 AM Called On Call Provider - Reached Doctor Paged Abbe Amsterdamopland, Jessica- MD 02/22/2016 10:58:14 AM Spoke with On Call - General Message Result Call Closed By: Gasper Sellsavid Partin Transaction Date/Time: 02/22/2016 10:46:57 AM (ET)

## 2016-02-27 DEATH — deceased

## 2016-11-11 ENCOUNTER — Other Ambulatory Visit: Payer: Self-pay | Admitting: Family Medicine
